# Patient Record
Sex: Male | Born: 2005 | Race: Black or African American | Hispanic: No | Marital: Single | Smoking: Never smoker
Health system: Southern US, Community
[De-identification: ages and names within clinical notes are randomized; demographics above are authoritative.]

## PROBLEM LIST (undated history)

## (undated) DIAGNOSIS — L309 Dermatitis, unspecified: Secondary | ICD-10-CM

## (undated) DIAGNOSIS — K429 Umbilical hernia without obstruction or gangrene: Secondary | ICD-10-CM

## (undated) HISTORY — PX: CYSTECTOMY: SUR359

---
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
## 2024-01-03 DIAGNOSIS — Z419 Encounter for procedure for purposes other than remedying health state, unspecified: Secondary | ICD-10-CM | POA: Diagnosis not present

## 2024-02-02 DIAGNOSIS — Z419 Encounter for procedure for purposes other than remedying health state, unspecified: Secondary | ICD-10-CM | POA: Diagnosis not present

## 2024-03-04 DIAGNOSIS — Z419 Encounter for procedure for purposes other than remedying health state, unspecified: Secondary | ICD-10-CM | POA: Diagnosis not present

## 2024-04-03 DIAGNOSIS — Z419 Encounter for procedure for purposes other than remedying health state, unspecified: Secondary | ICD-10-CM | POA: Diagnosis not present

## 2024-04-14 ENCOUNTER — Other Ambulatory Visit (HOSPITAL_COMMUNITY)
Admission: RE | Admit: 2024-04-14 | Discharge: 2024-04-14 | Disposition: A | Discharge status: Home / Self Care | Source: Ambulatory Visit | Attending: Family Medicine | Admitting: Family Medicine

## 2024-04-14 ENCOUNTER — Ambulatory Visit (INDEPENDENT_AMBULATORY_CARE_PROVIDER_SITE_OTHER): Admitting: Family Medicine

## 2024-04-14 VITALS — BP 116/56 | HR 79 | Ht 65.0 in | Wt 128.2 lb

## 2024-04-14 DIAGNOSIS — Z0000 Encounter for general adult medical examination without abnormal findings: Secondary | ICD-10-CM

## 2024-04-14 DIAGNOSIS — Z113 Encounter for screening for infections with a predominantly sexual mode of transmission: Secondary | ICD-10-CM | POA: Diagnosis not present

## 2024-04-14 NOTE — Patient Instructions (Signed)
 You can try using Debrox for ear wax - get this at any pharmacy  I will let you know if any results from today are abnormal

## 2024-04-14 NOTE — Progress Notes (Signed)
    SUBJECTIVE:   Chief compliant/HPI: annual examination  Mike Cooper is a 18 y.o. who presents today for an annual exam.   Interested in urine STD testing - does wear protection for the most part  Endorses occasional marijuana, no alcohol tobacco or other drugs  Wants to have ears washed out because he feels some wax buildup - no hearing loss, bleeding, fevers  Otherwise denies concerns  OBJECTIVE:   BP (!) 116/56   Pulse 79   Ht 5' 5 (1.651 m)   Wt 128 lb 4 oz (58.2 kg)   SpO2 99%   BMI 21.34 kg/m   General: NAD, pleasant, able to participate in exam Cardiac: RRR, no murmurs auscultated Respiratory: CTAB, normal WOB Abdomen: soft, non-tender, non-distended, normoactive bowel sounds Extremities: warm and well perfused, no edema or cyanosis Skin: warm and dry, no rashes noted Neuro: alert, no obvious focal deficits, speech normal Psych: Normal affect and mood  ASSESSMENT/PLAN:   Assessment & Plan Annual physical exam No significant concerns identified Discussed safety with regards to marijuana smoking and safe sex F/u 1 yr Screen for STD (sexually transmitted disease) Urine G/C/Trich today Annual Examination  See AVS for age appropriate recommendations  PHQ score 0, reviewed and discussed.  Blood pressure reviewed and at goal.      Considered the following items based upon USPSTF recommendations: Declines blood testing today GC/CTordered Lipid panel (nonfasting or fasting) discussed based upon AHA recommendations and not ordered.  Consider repeat every 4-6 years.  Reviewed risk factors for latent tuberculosis and not indicated Immunizations  - states he would prefer to come back for any vaccines   Follow up in 1 year or sooner if indicated.  MyChart Activation: Signed up today  Payton Coward, MD [facility]

## 2024-04-15 LAB — URINE CYTOLOGY ANCILLARY ONLY
Chlamydia: NEGATIVE
Comment: NEGATIVE
Comment: NEGATIVE
Comment: NORMAL
Neisseria Gonorrhea: NEGATIVE
Trichomonas: NEGATIVE

## 2024-04-16 ENCOUNTER — Ambulatory Visit: Payer: Self-pay | Admitting: Family Medicine

## 2024-05-04 DIAGNOSIS — Z419 Encounter for procedure for purposes other than remedying health state, unspecified: Secondary | ICD-10-CM | POA: Diagnosis not present

## 2024-06-04 DIAGNOSIS — Z419 Encounter for procedure for purposes other than remedying health state, unspecified: Secondary | ICD-10-CM | POA: Diagnosis not present

## 2024-09-03 DIAGNOSIS — Z419 Encounter for procedure for purposes other than remedying health state, unspecified: Secondary | ICD-10-CM | POA: Diagnosis not present
